# Patient Record
Sex: Male | Born: 1985 | Race: Black or African American | Hispanic: No | Marital: Single | Smoking: Current every day smoker
Health system: Southern US, Community
[De-identification: ages and names within clinical notes are randomized; demographics above are authoritative.]

## PROBLEM LIST (undated history)

## (undated) DIAGNOSIS — F419 Anxiety disorder, unspecified: Secondary | ICD-10-CM

## (undated) DIAGNOSIS — F39 Unspecified mood [affective] disorder: Secondary | ICD-10-CM

## (undated) DIAGNOSIS — F319 Bipolar disorder, unspecified: Secondary | ICD-10-CM

## (undated) DIAGNOSIS — F209 Schizophrenia, unspecified: Secondary | ICD-10-CM

## (undated) DIAGNOSIS — F70 Mild intellectual disabilities: Secondary | ICD-10-CM

---

## 2012-09-09 ENCOUNTER — Emergency Department (HOSPITAL_COMMUNITY)
Admission: EM | Admit: 2012-09-09 | Discharge: 2012-09-09 | Disposition: A | Discharge status: Home / Self Care | Payer: Medicaid Other | Attending: Emergency Medicine | Admitting: Emergency Medicine

## 2012-09-09 ENCOUNTER — Encounter (HOSPITAL_COMMUNITY): Payer: Self-pay | Admitting: Emergency Medicine

## 2012-09-09 DIAGNOSIS — S01501A Unspecified open wound of lip, initial encounter: Secondary | ICD-10-CM | POA: Insufficient documentation

## 2012-09-09 DIAGNOSIS — S01511A Laceration without foreign body of lip, initial encounter: Secondary | ICD-10-CM

## 2012-09-09 DIAGNOSIS — Z23 Encounter for immunization: Secondary | ICD-10-CM | POA: Insufficient documentation

## 2012-09-09 DIAGNOSIS — Z8659 Personal history of other mental and behavioral disorders: Secondary | ICD-10-CM | POA: Insufficient documentation

## 2012-09-09 HISTORY — DX: Anxiety disorder, unspecified: F41.9

## 2012-09-09 MED ORDER — BUPIVACAINE-EPINEPHRINE PF 0.5-1:200000 % IJ SOLN
1.8000 mL | Freq: Once | INTRAMUSCULAR | Status: AC
  Administered 2012-09-09: 9 mg
  Filled 2012-09-09: qty 1.8

## 2012-09-09 MED ORDER — OXYCODONE-ACETAMINOPHEN 5-325 MG PO TABS
1.0000 | ORAL_TABLET | Freq: Once | ORAL | Status: AC
  Administered 2012-09-09: 1 via ORAL
  Filled 2012-09-09: qty 1

## 2012-09-09 MED ORDER — TETANUS-DIPHTH-ACELL PERTUSSIS 5-2.5-18.5 LF-MCG/0.5 IM SUSP
0.5000 mL | Freq: Once | INTRAMUSCULAR | Status: AC
  Administered 2012-09-09: 0.5 mL via INTRAMUSCULAR
  Filled 2012-09-09: qty 0.5
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
## 2012-09-10 NOTE — ED Provider Notes (Signed)
Medical screening examination/treatment/procedure(s) were performed by non-physician practitioner and as supervising physician I was immediately available for consultation/collaboration.    Jace Dowe R Marlea Gambill, MD 09/10/12 1107 

## 2012-11-11 ENCOUNTER — Emergency Department (HOSPITAL_COMMUNITY): Payer: Medicaid Other

## 2012-11-11 ENCOUNTER — Emergency Department (HOSPITAL_COMMUNITY)
Admission: EM | Admit: 2012-11-11 | Discharge: 2012-11-11 | Disposition: A | Discharge status: Home / Self Care | Payer: Medicaid Other | Attending: Emergency Medicine | Admitting: Emergency Medicine

## 2012-11-11 ENCOUNTER — Encounter (HOSPITAL_COMMUNITY): Payer: Self-pay | Admitting: Emergency Medicine

## 2012-11-11 DIAGNOSIS — Z79899 Other long term (current) drug therapy: Secondary | ICD-10-CM | POA: Insufficient documentation

## 2012-11-11 DIAGNOSIS — R259 Unspecified abnormal involuntary movements: Secondary | ICD-10-CM | POA: Insufficient documentation

## 2012-11-11 DIAGNOSIS — Z8659 Personal history of other mental and behavioral disorders: Secondary | ICD-10-CM | POA: Insufficient documentation

## 2012-11-11 DIAGNOSIS — R079 Chest pain, unspecified: Secondary | ICD-10-CM | POA: Insufficient documentation

## 2012-11-11 DIAGNOSIS — J029 Acute pharyngitis, unspecified: Secondary | ICD-10-CM | POA: Insufficient documentation

## 2012-11-11 DIAGNOSIS — F411 Generalized anxiety disorder: Secondary | ICD-10-CM | POA: Insufficient documentation

## 2012-11-11 LAB — BASIC METABOLIC PANEL
CO2: 29 mEq/L (ref 19–32)
Calcium: 9.6 mg/dL (ref 8.4–10.5)
Chloride: 105 mEq/L (ref 96–112)
Glucose, Bld: 98 mg/dL (ref 70–99)
Potassium: 3.9 mEq/L (ref 3.5–5.1)
Sodium: 141 mEq/L (ref 135–145)

## 2012-11-11 LAB — CBC WITH DIFFERENTIAL/PLATELET
Basophils Absolute: 0 10*3/uL (ref 0.0–0.1)
Lymphocytes Relative: 52 % — ABNORMAL HIGH (ref 12–46)
Lymphs Abs: 2.3 10*3/uL (ref 0.7–4.0)
MCV: 85.2 fL (ref 78.0–100.0)
Neutro Abs: 1.9 10*3/uL (ref 1.7–7.7)
Neutrophils Relative %: 42 % — ABNORMAL LOW (ref 43–77)
Platelets: 168 10*3/uL (ref 150–400)
RBC: 4.8 MIL/uL (ref 4.22–5.81)
RDW: 14.3 % (ref 11.5–15.5)
WBC: 4.4 10*3/uL (ref 4.0–10.5)

## 2012-11-11 LAB — POCT I-STAT TROPONIN I: Troponin i, poc: 0.01 ng/mL (ref 0.00–0.08)

## 2012-11-11 LAB — RAPID STREP SCREEN (MED CTR MEBANE ONLY): Streptococcus, Group A Screen (Direct): NEGATIVE

## 2012-11-11 MED ORDER — ACETAMINOPHEN 500 MG PO TABS: 500.0000 mg | ORAL_TABLET | Freq: Four times a day (QID) | ORAL | Status: DC | PRN

## 2012-11-11 MED ORDER — LORAZEPAM 1 MG PO TABS
1.0000 mg | ORAL_TABLET | Freq: Once | ORAL | Status: DC
  Filled 2012-11-11: qty 1

## 2012-11-11 MED ORDER — IBUPROFEN 800 MG PO TABS
800.0000 mg | ORAL_TABLET | Freq: Once | ORAL | Status: AC
  Administered 2012-11-11: 800 mg via ORAL
  Filled 2012-11-11: qty 1

## 2012-11-11 MED ORDER — AMOXICILLIN 500 MG PO CAPS: 500.0000 mg | ORAL_CAPSULE | Freq: Three times a day (TID) | ORAL | Status: DC

## 2012-11-11 NOTE — ED Notes (Signed)
Contact numberLadene Artist [facility] [phone]

## 2012-11-11 NOTE — Progress Notes (Signed)
pcp is [facility] per pt

## 2012-11-11 NOTE — ED Notes (Signed)
Patient with intellectual deficit disorder according to program administrator who is at bedside reports chest pain, shakiness, nausea today which started around 10AM.  Patient denies any prior medical history or any prior symptoms of this nature.  Patient is normally active, plays sports, etc.

## 2012-11-11 NOTE — ED Provider Notes (Signed)
History    CSN: [id number] Arrival date & time 11/11/12  1610  First MD Initiated Contact with Patient 11/11/12 1618     Chief Complaint  Patient presents with  . Chest Pain   (Consider location/radiation/quality/duration/timing/severity/associated sxs/prior Treatment) HPI Lotus Swartzentruber is a 27 y.o.male with a PMH of intellectual deficit disorder and anxiety presents to the ER with complaints of  Sore throat, chest pain, shakiness, heart racing, and it being worsened after doing push ups at his school today. The patient is difficult to understand and informs that he has a disease in his head, something about his legs, his throat has been sore, and he had a pressure in his chest with some heart racing and shakiness at school today after using his small weights. He can't remember if this has ever happened before.  Past Medical History  Diagnosis Date  . Anxiety    History reviewed. No pertinent past surgical history. No family history on file. History  Substance Use Topics  . Smoking status: Not on file  . Smokeless tobacco: Not on file  . Alcohol Use: Not on file    Review of Systems  HENT: Positive for sore throat.   Cardiovascular: Positive for chest pain.  All other systems reviewed and are negative.   Level 5 caveat - intellectual deficit disorder  Allergies  Review of patient's allergies indicates no known allergies.  Home Medications   Current Outpatient Rx  Name  Route  Sig  Dispense  Refill  . benztropine (COGENTIN) 0.5 MG tablet   Oral   Take 0.5 mg by mouth at bedtime.         . clonazePAM (KLONOPIN) 0.5 MG tablet   Oral   Take 0.5 mg by mouth 2 (two) times daily.         . divalproex (DEPAKOTE ER) 500 MG 24 hr tablet   Oral   Take 1,000 mg by mouth at bedtime.          Marland Kitchen QUEtiapine (SEROQUEL XR) 400 MG 24 hr tablet   Oral   Take 400 mg by mouth at bedtime.         Marland Kitchen acetaminophen (TYLENOL) 500 MG tablet   Oral   Take 1 tablet (500  mg total) by mouth every 6 (six) hours as needed for pain.   30 tablet   0   . amoxicillin (AMOXIL) 500 MG capsule   Oral   Take 1 capsule (500 mg total) by mouth 3 (three) times daily.   21 capsule   0    BP 133/72  Pulse 76  Temp(Src) 98.5 F (36.9 C) (Oral)  Resp 16  SpO2 97% Physical Exam  Nursing note and vitals reviewed. Constitutional: He appears well-developed and well-nourished. No distress.  HENT:  Head: Normocephalic and atraumatic.  Mouth/Throat: Uvula is midline and mucous membranes are normal. Posterior oropharyngeal erythema present.  Eyes: Pupils are equal, round, and reactive to light.  Neck: Normal range of motion. Neck supple.  Cardiovascular: Normal rate and regular rhythm.   Pulmonary/Chest: Effort normal.  Abdominal: Soft.  Neurological: He is alert.  Skin: Skin is warm and dry.    ED Course  Procedures (including critical care time) Labs Reviewed  CBC WITH DIFFERENTIAL - Abnormal; Notable for the following:    Neutrophils Relative % 42 (*)    Lymphocytes Relative 52 (*)    All other components within normal limits  RAPID STREP SCREEN  CULTURE, GROUP A STREP  BASIC  METABOLIC PANEL  POCT I-STAT TROPONIN I   Dg Chest Portable 1 View  11/11/2012   *RADIOLOGY REPORT*  Clinical Data: Chest pains  CHEST - 1 VIEW  Comparison:  None.  Findings: The heart size and mediastinal contours are within normal limits.  Both lungs are clear.  IMPRESSION: No active disease.   Original Report Authenticated By: Judie Petit. Miles Costain, M.D.   1. Pharyngitis     MDM   Date: 11/11/2012  Rate: 82  Rhythm:  sinus rhythm  QRS Axis: normal  Intervals: normal  ST/T Wave abnormalities: normal  Conduction Disutrbances:none  Narrative Interpretation: probable left atrial abnormalities  Old EKG Reviewed: unchanged   Discussed case with Dr. Radford Pax, pt low risk for cardiac disease. Throat appears red, strep is negative but will treat with abx.  26 y.o.Jervis Perrot's  evaluation in the Emergency Department is complete. It has been determined that no acute conditions requiring further emergency intervention are present at this time. The patient/guardian have been advised of the diagnosis and plan. We have discussed signs and symptoms that warrant return to the ED, such as changes or worsening in symptoms.  Vital signs are stable at discharge. Filed Vitals:   11/11/12 1614  BP: 133/72  Pulse: 76  Temp: 98.5 F (36.9 C)  Resp: 16    Patient/guardian has voiced understanding and agreed to follow-up with the PCP or specialist.      Dorthula Matas, PA-C 11/11/12 1821

## 2012-11-11 NOTE — ED Notes (Signed)
[id number]<AV> Expected date:<BR> Expected time:<BR> Means of arrival:<BR> Comments:<BR>

## 2012-11-13 LAB — CULTURE, GROUP A STREP

## 2012-11-14 NOTE — ED Provider Notes (Signed)
Medical screening examination/treatment/procedure(s) were performed by non-physician practitioner and as supervising physician I was immediately available for consultation/collaboration.   Lorene Samaan L Kydan Shanholtzer, MD 11/14/12 1931 

## 2012-11-29 ENCOUNTER — Emergency Department (HOSPITAL_COMMUNITY): Payer: Medicaid Other

## 2012-11-29 ENCOUNTER — Encounter (HOSPITAL_COMMUNITY): Payer: Self-pay | Admitting: Emergency Medicine

## 2012-11-29 ENCOUNTER — Emergency Department (HOSPITAL_COMMUNITY)
Admission: EM | Admit: 2012-11-29 | Discharge: 2012-11-29 | Disposition: A | Discharge status: Home / Self Care | Payer: Medicaid Other | Attending: Emergency Medicine | Admitting: Emergency Medicine

## 2012-11-29 DIAGNOSIS — F411 Generalized anxiety disorder: Secondary | ICD-10-CM | POA: Insufficient documentation

## 2012-11-29 DIAGNOSIS — Z79899 Other long term (current) drug therapy: Secondary | ICD-10-CM | POA: Insufficient documentation

## 2012-11-29 DIAGNOSIS — T1490XA Injury, unspecified, initial encounter: Secondary | ICD-10-CM | POA: Insufficient documentation

## 2012-11-29 DIAGNOSIS — F319 Bipolar disorder, unspecified: Secondary | ICD-10-CM | POA: Insufficient documentation

## 2012-11-29 HISTORY — DX: Bipolar disorder, unspecified: F31.9
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
## 2012-11-29 NOTE — ED Provider Notes (Signed)
Medical screening examination/treatment/procedure(s) were performed by non-physician practitioner and as supervising physician I was immediately available for consultation/collaboration.   [name]. [name], MD 11/29/12 [phone]
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
## 2012-11-29 NOTE — ED Notes (Addendum)
Pt not observed in lobby at this time.  Pt was last observed walking in and out of lobby, talking on cell phone. Will call for him again in 15 minutes  Pt not observed in lobby. [facility] notified that pt is no longer waiting for transportation
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
## 2013-01-20 ENCOUNTER — Emergency Department (HOSPITAL_COMMUNITY)
Admission: EM | Admit: 2013-01-20 | Discharge: 2013-01-21 | Disposition: A | Discharge status: Home / Self Care | Payer: Medicaid Other | Attending: Emergency Medicine | Admitting: Emergency Medicine

## 2013-01-20 ENCOUNTER — Encounter (HOSPITAL_COMMUNITY): Payer: Self-pay | Admitting: *Deleted

## 2013-01-20 DIAGNOSIS — F29 Unspecified psychosis not due to a substance or known physiological condition: Secondary | ICD-10-CM | POA: Diagnosis present

## 2013-01-20 DIAGNOSIS — Z79899 Other long term (current) drug therapy: Secondary | ICD-10-CM | POA: Insufficient documentation

## 2013-01-20 DIAGNOSIS — F411 Generalized anxiety disorder: Secondary | ICD-10-CM | POA: Insufficient documentation

## 2013-01-20 DIAGNOSIS — F319 Bipolar disorder, unspecified: Secondary | ICD-10-CM | POA: Insufficient documentation

## 2013-01-20 DIAGNOSIS — F17200 Nicotine dependence, unspecified, uncomplicated: Secondary | ICD-10-CM | POA: Insufficient documentation

## 2013-01-20 DIAGNOSIS — IMO0002 Reserved for concepts with insufficient information to code with codable children: Secondary | ICD-10-CM | POA: Insufficient documentation

## 2013-01-20 LAB — RAPID URINE DRUG SCREEN, HOSP PERFORMED: Barbiturates: NOT DETECTED

## 2013-01-20 LAB — BASIC METABOLIC PANEL
BUN: 9 mg/dL (ref 6–23)
Calcium: 9.7 mg/dL (ref 8.4–10.5)
GFR calc Af Amer: >90 mL/min (ref >90)
GFR calc non Af Amer: 81 mL/min — ABNORMAL LOW (ref >90)
Glucose, Bld: 90 mg/dL (ref 70–99)
Sodium: 140 mEq/L (ref 135–145)

## 2013-01-20 LAB — CBC WITH DIFFERENTIAL/PLATELET
Basophils Relative: 0 % (ref 0–1)
Eosinophils Absolute: 0 10*3/uL (ref 0.0–0.7)
Eosinophils Relative: 0 % (ref 0–5)
Lymphs Abs: 1.6 10*3/uL (ref 0.7–4.0)
MCH: 29.6 pg (ref 26.0–34.0)
MCHC: 33.9 g/dL (ref 30.0–36.0)
MCV: 87.2 fL (ref 78.0–100.0)
Monocytes Relative: 6 % (ref 3–12)
Neutrophils Relative %: 78 % — ABNORMAL HIGH (ref 43–77)
Platelets: 185 10*3/uL (ref 150–400)
RBC: 4.7 MIL/uL (ref 4.22–5.81)

## 2013-01-20 MED ORDER — ONDANSETRON HCL 4 MG PO TABS: 4.0000 mg | ORAL_TABLET | Freq: Three times a day (TID) | ORAL | Status: DC | PRN

## 2013-01-20 MED ORDER — QUETIAPINE FUMARATE ER 400 MG PO TB24
400.0000 mg | ORAL_TABLET | Freq: Every day | ORAL | Status: DC
  Administered 2013-01-21: 400 mg via ORAL
  Filled 2013-01-20 (×2): qty 1

## 2013-01-20 MED ORDER — IBUPROFEN 200 MG PO TABS: 600.0000 mg | ORAL_TABLET | Freq: Three times a day (TID) | ORAL | Status: DC | PRN

## 2013-01-20 MED ORDER — ACETAMINOPHEN 325 MG PO TABS: 650.0000 mg | ORAL_TABLET | ORAL | Status: DC | PRN

## 2013-01-20 MED ORDER — BENZTROPINE MESYLATE 1 MG PO TABS
0.5000 mg | ORAL_TABLET | Freq: Every day | ORAL | Status: DC
  Administered 2013-01-21: 0.5 mg via ORAL
  Filled 2013-01-20: qty 1

## 2013-01-20 MED ORDER — CLONAZEPAM 0.5 MG PO TABS
0.5000 mg | ORAL_TABLET | Freq: Two times a day (BID) | ORAL | Status: DC
  Administered 2013-01-21 (×2): 0.5 mg via ORAL
  Filled 2013-01-20 (×2): qty 1

## 2013-01-20 MED ORDER — DIVALPROEX SODIUM ER 500 MG PO TB24
1000.0000 mg | ORAL_TABLET | Freq: Every day | ORAL | Status: DC
  Administered 2013-01-21: 1000 mg via ORAL
  Filled 2013-01-20 (×2): qty 2

## 2013-01-20 MED ORDER — ADULT MULTIVITAMIN W/MINERALS CH
1.0000 | ORAL_TABLET | Freq: Every day | ORAL | Status: DC
  Administered 2013-01-21: 1 via ORAL
  Filled 2013-01-20: qty 1

## 2013-01-20 NOTE — ED Provider Notes (Signed)
Medical screening examination/treatment/procedure(s) were performed by non-physician practitioner and as supervising physician I was immediately available for consultation/collaboration.   Richardean Canal, MD 01/20/13 [phone]
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
## 2013-01-20 NOTE — ED Notes (Addendum)
Penny bowl: [phone]                      [phone] Password: friend
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
## 2013-01-21 ENCOUNTER — Encounter (HOSPITAL_COMMUNITY): Payer: Self-pay | Admitting: Psychiatry

## 2013-01-21 DIAGNOSIS — F3160 Bipolar disorder, current episode mixed, unspecified: Secondary | ICD-10-CM

## 2013-01-21 DIAGNOSIS — F411 Generalized anxiety disorder: Secondary | ICD-10-CM

## 2013-01-21 DIAGNOSIS — F29 Unspecified psychosis not due to a substance or known physiological condition: Secondary | ICD-10-CM | POA: Diagnosis present

## 2013-01-21 MED ORDER — QUETIAPINE FUMARATE 100 MG PO TABS
100.0000 mg | ORAL_TABLET | Freq: Two times a day (BID) | ORAL | Status: DC
  Administered 2013-01-21: 100 mg via ORAL
  Filled 2013-01-21: qty 1

## 2013-01-21 NOTE — ED Provider Notes (Signed)
2:37 PM Pt has been evaluated by psych team, seroquel has been added. He will also return to his regular provider tomorrow for a medication assessment.  Pt and AFL in agreement.  Pt in stable condition, denies SI/HI  1. Bipolar 1 disorder   2. Psychosis       Shanna Cisco, MD 01/21/13 2146

## 2013-01-21 NOTE — ED Notes (Signed)
Modinat Anzat pt's group home provider  [phone]. Call after psych assessment to give update on pt's plan of care.
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
## 2013-01-21 NOTE — ED Notes (Signed)
Pt being discharged back to facility with caretaker. Awaiting caretaker to arrive to pick patient up. Pt stable at time of discharge.

## 2013-01-21 NOTE — Progress Notes (Addendum)
Per psychiatric NP patient psychiatrically stable to return to group home. Pt caregiver requested to speak with provider regarding concerns on patient medication. Pt caregiver will provide transportation back to group home once have spoken to provider. CSW left message for provider to speak with pt caregiver.   Catha Gosselin, LCSW [phone]  ED CSW .01/21/2013 1248pm   CSW, pt caregiver, and NP met with pt at bedisde to discuss medication concerns. Pt caregiver and pt agreed with adjusting medications in the morning. Pt psychiatrically stable to dc back to group home. .No further Clinical Social Work needs, signing off.    Catha Gosselin, LCSW [phone]  ED CSW .01/21/2013 12:48pm

## 2013-01-21 NOTE — ED Notes (Signed)
Patient has one bag of belongings are in locker 31.

## 2013-01-23 NOTE — Consult Note (Signed)
Note reviewed and agreed with  

## 2013-09-03 ENCOUNTER — Encounter (HOSPITAL_COMMUNITY): Payer: Self-pay | Admitting: Emergency Medicine

## 2013-09-03 ENCOUNTER — Emergency Department (INDEPENDENT_AMBULATORY_CARE_PROVIDER_SITE_OTHER)
Admission: EM | Admit: 2013-09-03 | Discharge: 2013-09-03 | Disposition: A | Discharge status: Home / Self Care | Payer: Medicaid Other | Source: Home / Self Care | Attending: Emergency Medicine | Admitting: Emergency Medicine

## 2013-09-03 ENCOUNTER — Emergency Department (INDEPENDENT_AMBULATORY_CARE_PROVIDER_SITE_OTHER): Payer: Medicaid Other

## 2013-09-03 DIAGNOSIS — S93409A Sprain of unspecified ligament of unspecified ankle, initial encounter: Secondary | ICD-10-CM

## 2013-09-03 DIAGNOSIS — S96912A Strain of unspecified muscle and tendon at ankle and foot level, left foot, initial encounter: Secondary | ICD-10-CM

## 2013-09-03 HISTORY — DX: Unspecified mood (affective) disorder: F39

## 2013-09-03 HISTORY — DX: Schizophrenia, unspecified: F20.9

## 2013-09-03 HISTORY — DX: Mild intellectual disabilities: F70

## 2013-09-03 MED ORDER — IBUPROFEN 800 MG PO TABS: 800.0000 mg | ORAL_TABLET | Freq: Three times a day (TID) | ORAL | Status: DC | PRN

## 2013-09-03 NOTE — ED Provider Notes (Signed)
CSN: [id number]     Arrival date & time 09/03/13  1647 History   First MD Initiated Contact with Patient 09/03/13 1818     Chief Complaint  Patient presents with  . Ankle Pain   (Consider location/radiation/quality/duration/timing/severity/associated sxs/prior Treatment) HPI  28 year old M with schizophrenia who presents with a caregiver for evaluation of ankle pain. According to the caregiver, the patient [facility] and was having aggressive behavior. Therefore he was placed in restraints. After being placed in restraints, the patient was limping on his left ankle. He also complained of medial ankle pain. The patient was aggressive again today and was placed in restraints again. He continued to complain of left ankle pain, and was therefore brought to the urgent care for further evaluation. The patient himself is not able to provide a concise history and speaks with flight of ideas.   Past Medical History  Diagnosis Date  . Anxiety   . Bipolar 1 disorder   . Schizophrenia   . Mild mental retardation   . Mood disorder    History reviewed. No pertinent past surgical history. No family history on file. History  Substance Use Topics  . Smoking status: Current Every Day Smoker  . Smokeless tobacco: Not on file  . Alcohol Use: No    Review of Systems  Allergies  Review of patient's allergies indicates no known allergies.  Home Medications   Prior to Admission medications   Medication Sig Start Date End Date Taking? Authorizing Provider  benztropine (COGENTIN) 0.5 MG tablet Take 0.5 mg by mouth at bedtime.    Historical Provider, MD  clonazePAM (KLONOPIN) 0.5 MG tablet Take 0.5 mg by mouth 2 (two) times daily.    Historical Provider, MD  divalproex (DEPAKOTE ER) 500 MG 24 hr tablet Take 1,000 mg by mouth at bedtime.     Historical Provider, MD  Multiple Vitamin (MULTIVITAMIN WITH MINERALS) TABS tablet Take 1 tablet by mouth daily.     Historical Provider, MD  QUEtiapine (SEROQUEL XR) 400 MG 24 hr tablet Take 400 mg by mouth at bedtime.    Historical Provider, MD   BP 138/76  Pulse 121  Temp(Src) 99.2 F (37.3 C) (Oral)  Resp 18  SpO2 98% Physical Exam Gen: young AAM, non ill appearing, thin body habitus RLE: no edema, no tenderness LLE: no edema, TTP along left medial malleolus, left ankle with normal ROM Pysch: flat affect, responsive to questioning with with tangential speech  ED Course  Procedures (including critical care time) Labs Review Labs Reviewed - No data to display  Imaging Review Dg Ankle Complete Left  09/03/2013   CLINICAL DATA:  Pain  EXAM: LEFT ANKLE COMPLETE - 3+ VIEW  COMPARISON:  11/29/2012  FINDINGS: There are small corticated densities inferior to the medial malleolus and tip of the fibula consistent with old avulsion injuries. No visible acute fracture. The may be a small amount of joint fluid.  IMPRESSION: Old corticated avulsion fragments medially and laterally. No acute bony finding seen. Possible small amount of joint fluid.   Electronically Signed   By: Paulina FusiMark  Shogry M.D.   On: 09/03/2013 19:25     MDM   1. Left ankle strain    No concern for serious soft tissue injury and no evidence of fracture. Treat symptomatically.     Garnetta BuddyEdward V Harly Pipkins, MD 09/03/13 1944

## 2013-09-03 NOTE — Discharge Instructions (Signed)
Eman most likely has a minor strain of the ligaments of the left ankle. There is no fracture or concern for tears to ligaments or tendons. This should heal on it's own in 2-3 weeks. He can use ibuprofen and ice on his ankle if needed. If his ankle gets more painful or swollen, then please follow up here or with an orthopedic doctor.   Take Care,   Dr. Clinton SawyerWilliamson
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
## 2013-09-04 NOTE — ED Provider Notes (Signed)
Medical screening examination/treatment/procedure(s) were performed by a resident physician and as supervising physician I was immediately available for consultation/collaboration.  Leslee Homeavid Jezabella Schriever, M.D.  Reuben Likesavid C Kynzlee Hucker, MD 09/04/13 1332

## 2014-09-02 IMAGING — CR DG ANKLE COMPLETE 3+V*L*
3 series · 3 of 3 positions shown · non-contrast
Comparison: 11/29/2012

CLINICAL DATA: Pain

EXAM:
LEFT ANKLE COMPLETE - 3+ VIEW

[view not recorded (1 of 3)]
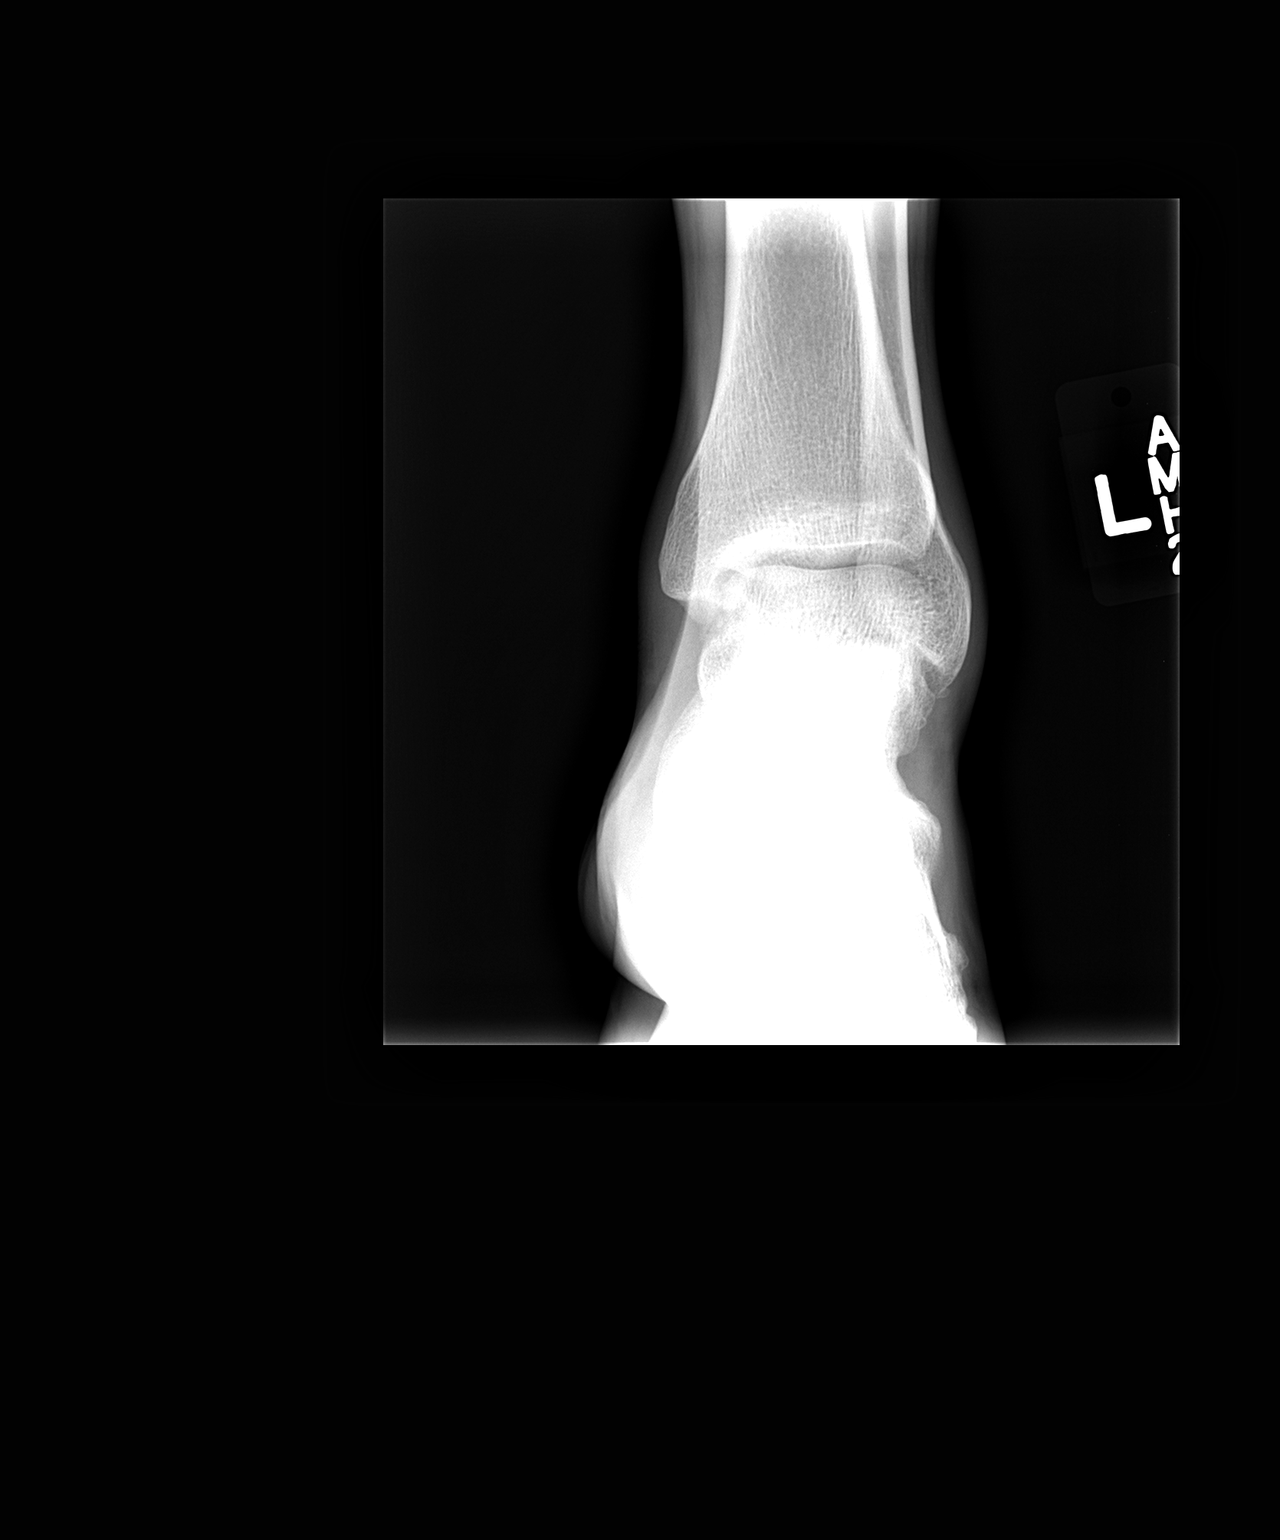

[view not recorded (2 of 3)]
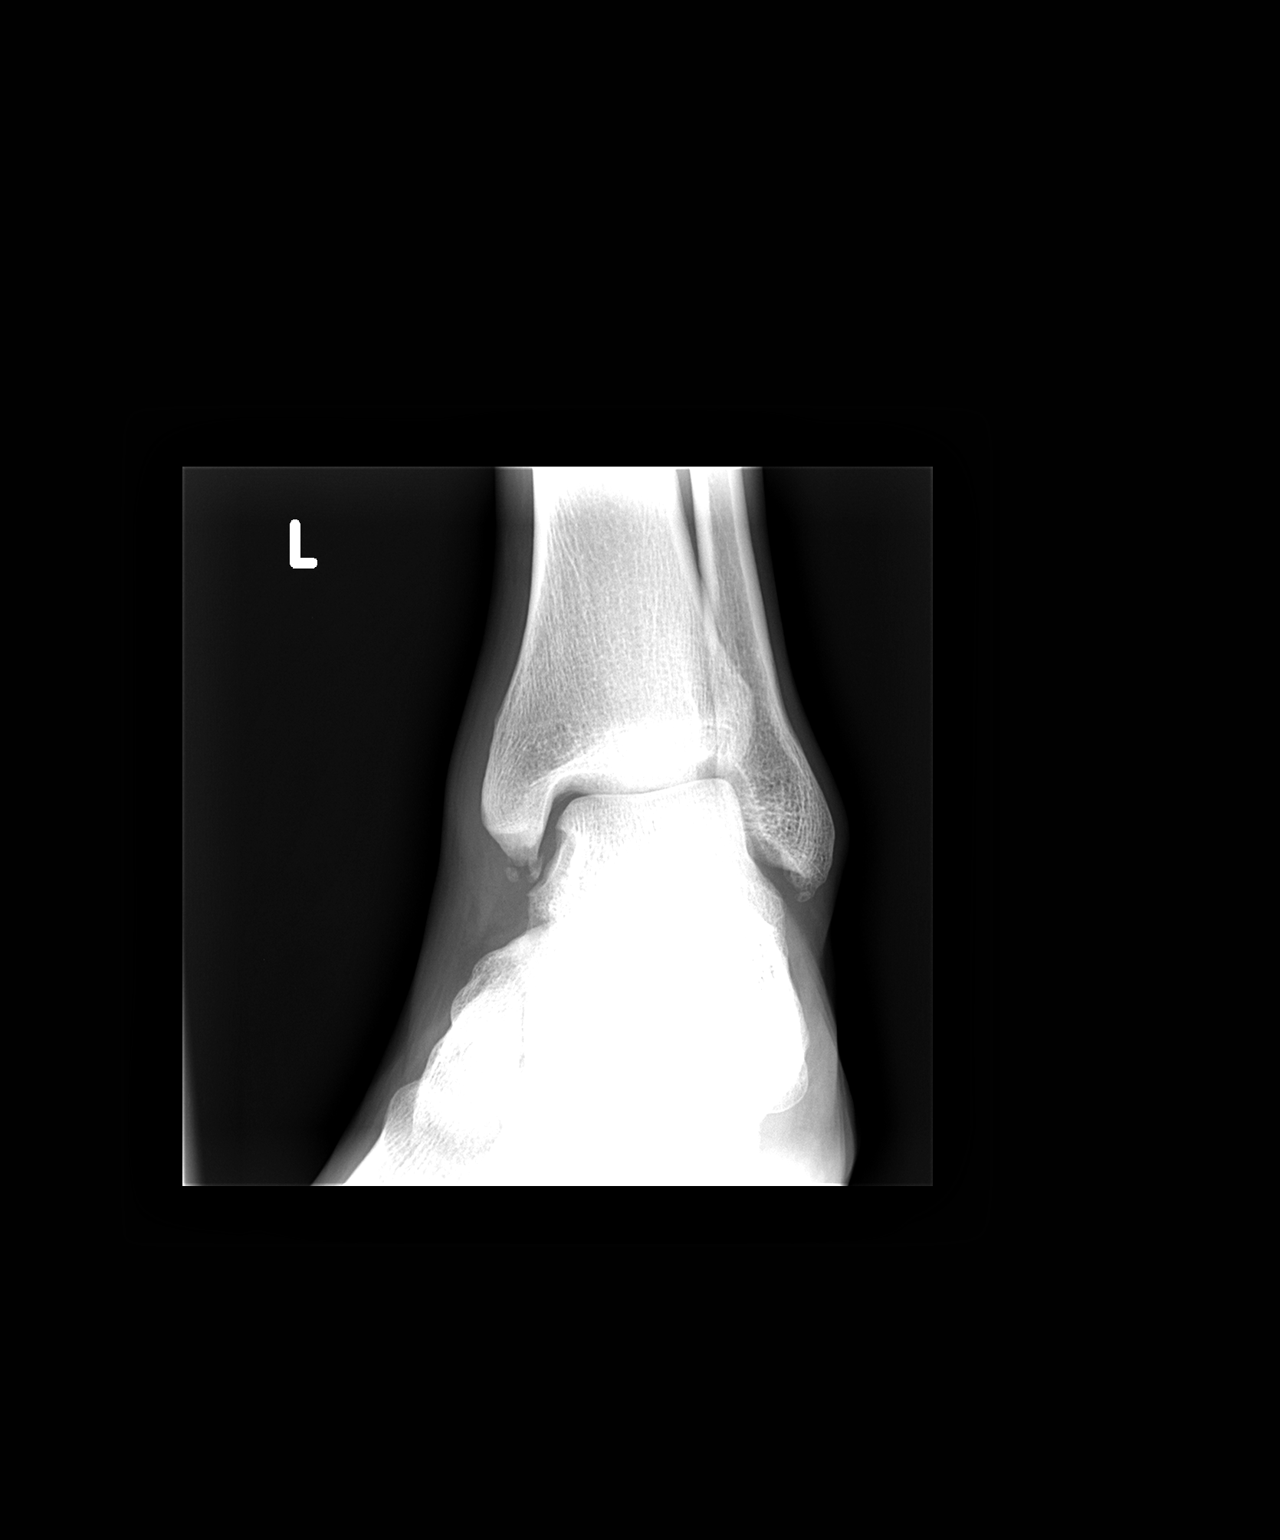

[view not recorded (3 of 3)]
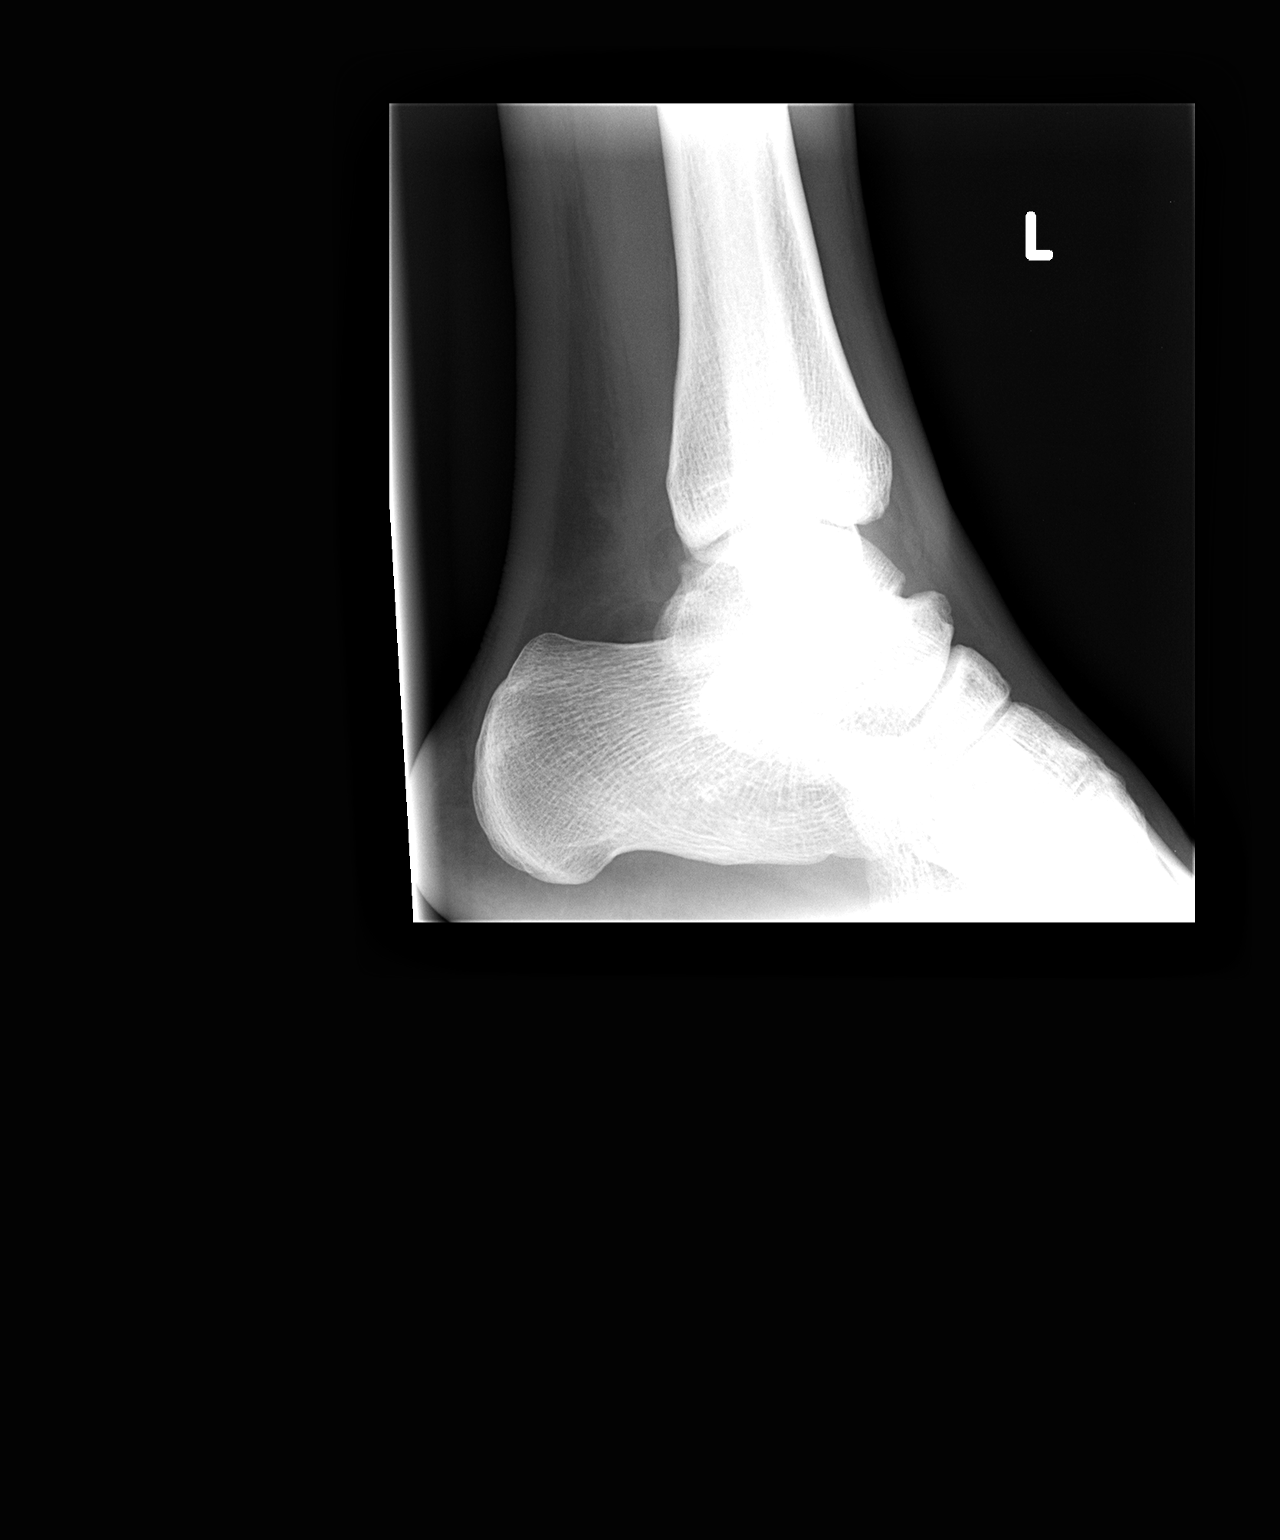

[3 of 3 positions shown; findings below may reference images not displayed]

FINDINGS: There are small corticated densities inferior to the medial
malleolus and tip of the fibula consistent with old avulsion
injuries. No visible acute fracture. [REDACTED] be a small amount of
joint fluid.
IMPRESSION: Old corticated avulsion fragments medially and laterally. No acute
bony finding seen. Possible small amount of joint fluid.

## 2016-05-30 ENCOUNTER — Ambulatory Visit (HOSPITAL_COMMUNITY)
Admission: EM | Admit: 2016-05-30 | Discharge: 2016-05-30 | Disposition: A | Discharge status: Home / Self Care | Payer: Medicaid Other

## 2017-07-11 ENCOUNTER — Encounter (HOSPITAL_COMMUNITY): Payer: Self-pay | Admitting: Emergency Medicine

## 2017-07-11 ENCOUNTER — Ambulatory Visit (HOSPITAL_COMMUNITY)
Admission: EM | Admit: 2017-07-11 | Discharge: 2017-07-11 | Disposition: A | Discharge status: Home / Self Care | Payer: Medicaid Other | Attending: Family Medicine | Admitting: Family Medicine

## 2017-07-11 ENCOUNTER — Telehealth (HOSPITAL_COMMUNITY): Payer: Self-pay | Admitting: Emergency Medicine

## 2017-07-11 ENCOUNTER — Ambulatory Visit (INDEPENDENT_AMBULATORY_CARE_PROVIDER_SITE_OTHER): Payer: Medicaid Other

## 2017-07-11 ENCOUNTER — Other Ambulatory Visit: Payer: Self-pay

## 2017-07-11 DIAGNOSIS — S93492A Sprain of other ligament of left ankle, initial encounter: Secondary | ICD-10-CM | POA: Diagnosis not present

## 2017-07-11 DIAGNOSIS — Y9367 Activity, basketball: Secondary | ICD-10-CM | POA: Diagnosis not present

## 2017-07-11 DIAGNOSIS — M25572 Pain in left ankle and joints of left foot: Secondary | ICD-10-CM | POA: Diagnosis not present

## 2017-07-11 MED ORDER — IBUPROFEN 800 MG PO TABS: 800.0000 mg | ORAL_TABLET | Freq: Three times a day (TID) | ORAL | 0 refills | Status: DC | PRN

## 2017-07-11 NOTE — ED Triage Notes (Signed)
Patient was playing basketball.  Patient injured left leg yesterday.  Complains of pain in left ankle

## 2017-07-11 NOTE — Discharge Instructions (Addendum)
Please follow up with your doctor or here in 7-10 days if you are not steadily improving.

## 2017-07-16 NOTE — ED Provider Notes (Signed)
[facility]   [id number] 07/11/17 Arrival Time: 1620  ASSESSMENT & PLAN:  1. Sprain of anterior talofibular ligament of left ankle, initial encounter     Imaging: Dg Ankle Complete Left  Result Date: 07/11/2017 CLINICAL DATA:  Rolled left ankle internally. While playing basketball. Pain around lateral malleolus. EXAM: LEFT ANKLE COMPLETE - 3+ VIEW COMPARISON:  09/03/2013. FINDINGS: There is no acute fracture or dislocation identified. There are multiple well corticated os ossific densities adjacent to the medial malleolus compatible with sequelae of prior trauma. No acute fractures or dislocations identified. Mild lateral soft tissue swelling. IMPRESSION: 1. No acute bone abnormality. 2. Lateral soft tissue swelling. Electronically Signed   By: Signa Kell M.D.   On: 07/11/2017 17:40   ASO applied. WBAT.  Meds ordered this encounter  Medications  . ibuprofen (ADVIL,MOTRIN) 800 MG tablet    Sig: Take 1 tablet (800 mg total) by mouth every 8 (eight) hours as needed.    Dispense:  30 tablet    Refill:  0   Written instructions re: ankle sprain given. To f/u with PCP or here if not showing significant improvement over the next week.  Reviewed expectations re: course of current medical issues. Questions answered. Outlined signs and symptoms indicating need for more acute intervention. Patient verbalized understanding. After Visit Summary given.  SUBJECTIVE: History from: patient. Terry Conner is a 32 y.o. male who reports localized mild pain of his left lateral ankle that is stable; intermittent; described as aching without radiation. Onset: abrupt, yesterday. Injury/trama: yes, reports twisting ankle while playing basketball; able to bear weight immediately and since Relieved by: rest. Worsened by: certain movements. Associated symptoms: none reported. Extremity sensation changes or weakness: none. Self treatment: tried OTCs with relief of pain. History of  similar: no  ROS: As per HPI.   OBJECTIVE:  Vitals:   07/11/17 1708  BP: 131/78  Pulse: 99  Resp: 18  Temp: 98.2 F (36.8 C)  TempSrc: Oral  SpO2: 100%    General appearance: alert; no distress Extremities: no cyanosis or edema; symmetrical with no gross deformities; localized tenderness over his left ankle (ATFL distribution) with mild swelling and no bruising; ROM: normal but reports soreness CV: normal extremity capillary refill Skin: warm and dry Neurologic: normal gait; normal symmetric reflexes in all extremities; normal sensation in all extremities Psychological: alert and cooperative; normal mood and affect  No Known Allergies  Past Medical History:  Diagnosis Date  . Anxiety   . Bipolar 1 disorder (HCC)   . Mild mental retardation   . Mood disorder (HCC)   . Schizophrenia [facility])    Social History   Socioeconomic History  . Marital status: Single    Spouse name: Not on file  . Number of children: Not on file  . Years of education: Not on file  . Highest education level: Not on file  Social Needs  . Financial resource strain: Not on file  . Food insecurity - worry: Not on file  . Food insecurity - inability: Not on file  . Transportation needs - medical: Not on file  . Transportation needs - non-medical: Not on file  Occupational History  . Not on file  Tobacco Use  . Smoking status: Current Every Day Smoker  Substance and Sexual Activity  . Alcohol use: No  . Drug use: No  . Sexual activity: Not on file  Other Topics Concern  . Not on file  Social History Narrative  . Not on file  History reviewed. No pertinent surgical history.    Mardella LaymanHagler, Brier Firebaugh, MD 07/16/17 [phone]

## 2017-07-27 ENCOUNTER — Ambulatory Visit (HOSPITAL_COMMUNITY)
Admission: EM | Admit: 2017-07-27 | Discharge: 2017-07-27 | Disposition: A | Discharge status: Home / Self Care | Payer: Medicaid Other | Attending: Emergency Medicine | Admitting: Emergency Medicine

## 2017-07-27 ENCOUNTER — Encounter (HOSPITAL_COMMUNITY): Payer: Self-pay | Admitting: Emergency Medicine

## 2017-07-27 DIAGNOSIS — S93492D Sprain of other ligament of left ankle, subsequent encounter: Secondary | ICD-10-CM | POA: Diagnosis not present

## 2017-07-27 MED ORDER — IBUPROFEN 600 MG PO TABS: 600.0000 mg | ORAL_TABLET | Freq: Four times a day (QID) | ORAL | 0 refills | Status: DC | PRN

## 2017-07-27 MED ORDER — ACETAMINOPHEN 500 MG PO TABS: 1000.0000 mg | ORAL_TABLET | Freq: Four times a day (QID) | ORAL | 0 refills | Status: DC | PRN

## 2017-07-27 NOTE — Discharge Instructions (Signed)
Wear the brace when you play sports for the next several weeks.  If your ankle hurts after playing, take 600 mg ibuprofen with 1 g of Tylenol, ice and elevate your ankle.  Follow-up with the [facility] sports medicine center if it is still giving you problems in 2 or 3 weeks.

## 2017-07-27 NOTE — ED Provider Notes (Signed)
HPI  SUBJECTIVE:  Terry Conner is a 32 y.o. male who presents with for follow-up of an ankle sprain. Patient originally injured on 2/19.  He was seen here on 2/20, was found to have a sprain of the ATFL.  X-ray showed lateral soft tissue swelling but no fractures.  he was sent home with ibuprofen.  Patient states that his ankle is fine.  he has not needed medication or the brace in 1 week but that he returns to clinic because it said to do so on his discharge instructions.  There are no aggravating or alleviating factors.  No numbness, tingling, swelling, erythema, bruising.  He has a past medical history of schizophrenia, bipolar disorder, mild mental retardation.  PMD: Dr. Claudie ReveringAvubere  Past Medical History:  Diagnosis Date  . Anxiety   . Bipolar 1 disorder (HCC)   . Mild mental retardation   . Mood disorder (HCC)   . Schizophrenia (HCC)     History reviewed. No pertinent surgical history.  History reviewed. No pertinent family history.  Social History   Tobacco Use  . Smoking status: Current Every Day Smoker  Substance Use Topics  . Alcohol use: No  . Drug use: No    No current facility-administered medications for this encounter.   Current Outpatient Medications:  .  acetaminophen (TYLENOL) 500 MG tablet, Take 2 tablets (1,000 mg total) by mouth every 6 (six) hours as needed., Disp: 30 tablet, Rfl: 0 .  benztropine (COGENTIN) 0.5 MG tablet, Take 0.5 mg by mouth at bedtime., Disp: , Rfl:  .  clonazePAM (KLONOPIN) 0.5 MG tablet, Take 0.5 mg by mouth 2 (two) times daily., Disp: , Rfl:  .  divalproex (DEPAKOTE ER) 500 MG 24 hr tablet, Take 1,000 mg by mouth at bedtime. , Disp: , Rfl:  .  ibuprofen (ADVIL,MOTRIN) 600 MG tablet, Take 1 tablet (600 mg total) by mouth every 6 (six) hours as needed., Disp: 30 tablet, Rfl: 0 .  ibuprofen (ADVIL,MOTRIN) 800 MG tablet, Take 1 tablet (800 mg total) by mouth every 8 (eight) hours as needed., Disp: 30 tablet, Rfl: 0 .  Multiple Vitamin  (MULTIVITAMIN WITH MINERALS) TABS tablet, Take 1 tablet by mouth daily., Disp: , Rfl:  .  QUEtiapine (SEROQUEL XR) 400 MG 24 hr tablet, Take 400 mg by mouth at bedtime., Disp: , Rfl:   No Known Allergies   ROS  As noted in HPI.   Physical Exam  BP (!) 139/99 (BP Location: Right Arm)   Pulse 94   Temp 98.3 F (36.8 C) (Oral)   Resp 18   SpO2 100%   Constitutional: Well developed, well nourished, no acute distress Eyes:  EOMI, conjunctiva normal bilaterally HENT: Normocephalic, atraumatic,mucus membranes moist Respiratory: Normal inspiratory effort Cardiovascular: Normal rate GI: nondistended skin: No rash, skin intact Musculoskeletal:  L Ankle no Tenderness entire joint, Proximal fibula NT, Distal fibula NT,Medial malleolus NT,  Deltoid ligament medially NT,  Lateral ligaments NT, ATFL laterally NT, posterior tablofibular ligament laterally  NT , calcaneofibular ligament laterally NT,  Achilles NT, calcaneus NT,  Proximal 5th metatarsal NT, Midfoot NT, distal NVI with baseline sensation / motor to foot . no pain with dorsiflexion/plantar flexion. Mild pain with inversion. No pain with eversion.- bruising. - squeeze test. Ant drawer test stable. Pt able to bear weight in dept.  Neurologic: Alert & oriented x 3, no focal neuro deficits Psychiatric: Speech and behavior appropriate   ED Course   Medications - No data to display  No  orders of the defined types were placed in this encounter.   No results found for this or any previous visit (from the past 24 hour(s)). No results found.  ED Clinical Impression  Sprain of anterior talofibular ligament of left ankle, subsequent encounter   ED Assessment/Plan  Previous note and imaging independently reviewed.  As noted in HPI.  Seems to be doing well however he still has some mild tenderness With ankle inversion.  Advised him to wear the brace while playing sports for the next week or 2, if it starts to hurt, and he may take  600 mg ibuprofen with 1 g of Tylenol, ice it for 20 minutes and elevate it.  Wrote prescriptions for this.  Will refer him to the [facility] sports medicine clinic if it is still bothering him in 2 or 3 weeks.  Otherwise, follow-up with PMD as needed.     Meds ordered this encounter  Medications  . ibuprofen (ADVIL,MOTRIN) 600 MG tablet    Sig: Take 1 tablet (600 mg total) by mouth every 6 (six) hours as needed.    Dispense:  30 tablet    Refill:  0  . acetaminophen (TYLENOL) 500 MG tablet    Sig: Take 2 tablets (1,000 mg total) by mouth every 6 (six) hours as needed.    Dispense:  30 tablet    Refill:  0    *This clinic note was created using Scientist, clinical (histocompatibility and immunogenetics). Therefore, there may be occasional mistakes despite careful proofreading.   ?   Domenick Gong, MD 07/28/17 [phone]

## 2017-07-27 NOTE — ED Triage Notes (Signed)
Pt here for recheck to left ankle

## 2017-12-16 ENCOUNTER — Encounter (HOSPITAL_COMMUNITY): Payer: Self-pay | Admitting: Emergency Medicine

## 2017-12-16 ENCOUNTER — Emergency Department (HOSPITAL_COMMUNITY)
Admission: EM | Admit: 2017-12-16 | Discharge: 2017-12-17 | Disposition: A | Discharge status: Home / Self Care | Payer: Medicaid Other | Attending: Emergency Medicine | Admitting: Emergency Medicine

## 2017-12-16 DIAGNOSIS — R4689 Other symptoms and signs involving appearance and behavior: Secondary | ICD-10-CM

## 2017-12-16 DIAGNOSIS — F319 Bipolar disorder, unspecified: Secondary | ICD-10-CM | POA: Insufficient documentation

## 2017-12-16 DIAGNOSIS — F4324 Adjustment disorder with disturbance of conduct: Secondary | ICD-10-CM | POA: Diagnosis not present

## 2017-12-16 DIAGNOSIS — Z79899 Other long term (current) drug therapy: Secondary | ICD-10-CM | POA: Insufficient documentation

## 2017-12-16 LAB — CBC WITH DIFFERENTIAL/PLATELET
BASOS PCT: 0 %
Basophils Absolute: 0 10*3/uL (ref 0.0–0.1)
Eosinophils Absolute: 0 10*3/uL (ref 0.0–0.7)
Eosinophils Relative: 1 %
HCT: 41.3 % (ref 39.0–52.0)
Hemoglobin: 14 g/dL (ref 13.0–17.0)
LYMPHS ABS: 1.8 10*3/uL (ref 0.7–4.0)
Lymphocytes Relative: 24 %
MCH: 29 pg (ref 26.0–34.0)
MCHC: 33.9 g/dL (ref 30.0–36.0)
MCV: 85.7 fL (ref 78.0–100.0)
MONO ABS: 0.6 10*3/uL (ref 0.1–1.0)
MONOS PCT: 9 %
NEUTROS ABS: 4.9 10*3/uL (ref 1.7–7.7)
Neutrophils Relative %: 66 %
Platelets: 211 10*3/uL (ref 150–400)
RBC: 4.82 MIL/uL (ref 4.22–5.81)
RDW: 13.8 % (ref 11.5–15.5)
WBC: 7.3 10*3/uL (ref 4.0–10.5)

## 2017-12-16 LAB — COMPREHENSIVE METABOLIC PANEL
ALBUMIN: 3.7 g/dL (ref 3.5–5.0)
ALT: 34 U/L (ref 0–44)
AST: 33 U/L (ref 15–41)
Alkaline Phosphatase: 42 U/L (ref 38–126)
Anion gap: 9 (ref 5–15)
BILIRUBIN TOTAL: 0.7 mg/dL (ref 0.3–1.2)
BUN: 8 mg/dL (ref 6–20)
CHLORIDE: 105 mmol/L (ref 98–111)
CO2: 25 mmol/L (ref 22–32)
Calcium: 9.7 mg/dL (ref 8.9–10.3)
Creatinine, Ser: 1.15 mg/dL (ref 0.61–1.24)
GFR calc Af Amer: >60 mL/min (ref >60)
GFR calc non Af Amer: >60 mL/min (ref >60)
GLUCOSE: 106 mg/dL — AB (ref 70–99)
POTASSIUM: 4 mmol/L (ref 3.5–5.1)
SODIUM: 139 mmol/L (ref 135–145)
TOTAL PROTEIN: 7.3 g/dL (ref 6.5–8.1)

## 2017-12-16 LAB — RAPID URINE DRUG SCREEN, HOSP PERFORMED
AMPHETAMINES: NOT DETECTED
BARBITURATES: NOT DETECTED
BENZODIAZEPINES: NOT DETECTED
Cocaine: NOT DETECTED
Opiates: NOT DETECTED
TETRAHYDROCANNABINOL: NOT DETECTED

## 2017-12-16 LAB — ETHANOL: Alcohol, Ethyl (B): <10 mg/dL (ref <10)
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
## 2017-12-16 NOTE — ED Notes (Signed)
Patient arrived to unit, accompanied by [employer]. Patient appropriate and cooperative with care currently. Patient becoming tearful and states repeatedly "I'm sorry. I don't want to hurt nobody". Patient states "I got mad because I was on the the phone with my cousin". Patient unable to verbalize any further details about the reason he was in the ER. Patient denies any thoughts of wanting to hurt himself or others at this time. No distress noted.
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
## 2017-12-16 NOTE — ED Notes (Signed)
Bed: WBH34 Expected date:  Expected time:  Means of arrival:  Comments: 
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
## 2017-12-17 DIAGNOSIS — R4689 Other symptoms and signs involving appearance and behavior: Secondary | ICD-10-CM | POA: Diagnosis present

## 2017-12-17 DIAGNOSIS — F4324 Adjustment disorder with disturbance of conduct: Secondary | ICD-10-CM | POA: Diagnosis present

## 2017-12-17 MED ORDER — QUETIAPINE FUMARATE ER 300 MG PO TB24
300.0000 mg | ORAL_TABLET | Freq: Every day | ORAL | Status: DC
  Administered 2017-12-17: 300 mg via ORAL
  Filled 2017-12-17: qty 1

## 2017-12-17 MED ORDER — DIVALPROEX SODIUM ER 250 MG PO TB24: 250.0000 mg | ORAL_TABLET | ORAL | Status: DC

## 2017-12-17 MED ORDER — DIVALPROEX SODIUM ER 500 MG PO TB24: 500.0000 mg | ORAL_TABLET | ORAL | Status: DC

## 2017-12-17 MED ORDER — BENZTROPINE MESYLATE 0.5 MG PO TABS
0.5000 mg | ORAL_TABLET | Freq: Every day | ORAL | Status: DC
  Administered 2017-12-17: 0.5 mg via ORAL
  Filled 2017-12-17: qty 1

## 2017-12-17 MED ORDER — CLONAZEPAM 0.5 MG PO TABS
0.5000 mg | ORAL_TABLET | Freq: Two times a day (BID) | ORAL | Status: DC
  Administered 2017-12-17 (×2): 0.5 mg via ORAL
  Filled 2017-12-17 (×2): qty 1

## 2017-12-17 NOTE — Progress Notes (Addendum)
CSW aware that patient has been psychiatrically cleared and is ready for discharge. CSW spoke with [facility], Carollee HerterShannon at [phone] and informed her that patient has been psychiatrically cleared and is ready for discharge. Per Carollee HerterShannon, patient is a resident at an [facility] through [facility]. Per Carollee HerterShannon, patient does NOT have a legal guardian.   CSW spoke with Alycia with [facility] [phone] and informed her that patient has been psychiatrically cleared and is ready for discharge. Alycia expressed concerns with patient being psychiatrically cleared. CSW informed Leanord Hawkinglycia that patient is denying SI/HI/AVH at this time. Per Leanord HawkingAlycia, patient's current caretaker is unavailable and she would have to coordinate to find him another place to go. CSW to follow up with Alycia.   Archie BalboaMackenzie Irwin, LCSWA  Clinical Social Work Department  [facility]  [phone]

## 2017-12-17 NOTE — ED Notes (Signed)
Pt discharged with discharge instructions.  All belongings were returned to pt.
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
